# Patient Record
Sex: Male | Born: 1985 | Race: Black or African American | Hispanic: No | Marital: Single
Health system: Southern US, Community
[De-identification: ages and names within clinical notes are randomized; demographics above are authoritative.]

## PROBLEM LIST (undated history)

## (undated) DIAGNOSIS — F319 Bipolar disorder, unspecified: Secondary | ICD-10-CM

## (undated) DIAGNOSIS — F411 Generalized anxiety disorder: Secondary | ICD-10-CM

## (undated) DIAGNOSIS — F410 Panic disorder [episodic paroxysmal anxiety] without agoraphobia: Secondary | ICD-10-CM

## (undated) DIAGNOSIS — F209 Schizophrenia, unspecified: Secondary | ICD-10-CM

## (undated) DIAGNOSIS — F909 Attention-deficit hyperactivity disorder, unspecified type: Secondary | ICD-10-CM

## (undated) DIAGNOSIS — F812 Mathematics disorder: Secondary | ICD-10-CM

## (undated) DIAGNOSIS — F419 Anxiety disorder, unspecified: Secondary | ICD-10-CM

## (undated) DIAGNOSIS — F810 Specific reading disorder: Secondary | ICD-10-CM

---
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
## 2008-06-23 ENCOUNTER — Emergency Department (HOSPITAL_COMMUNITY): Admission: EM | Admit: 2008-06-23 | Discharge: 2008-06-23 | Payer: Self-pay | Admitting: Emergency Medicine

## 2010-03-06 ENCOUNTER — Emergency Department (HOSPITAL_COMMUNITY): Admission: EM | Admit: 2010-03-06 | Discharge: 2010-03-06 | Payer: Self-pay | Admitting: Emergency Medicine

## 2010-09-24 ENCOUNTER — Inpatient Hospital Stay (HOSPITAL_COMMUNITY)
Admission: RE | Admit: 2010-09-24 | Discharge: 2010-09-29 | DRG: 885 | Disposition: A | Discharge status: Home / Self Care | Payer: 59 | Source: Ambulatory Visit | Attending: Psychiatry | Admitting: Psychiatry

## 2010-09-24 ENCOUNTER — Emergency Department (HOSPITAL_COMMUNITY)
Admission: EM | Admit: 2010-09-24 | Discharge: 2010-09-24 | Disposition: A | Discharge status: Other Acute Inpatient Hospital | Payer: Self-pay | Attending: Emergency Medicine | Admitting: Emergency Medicine

## 2010-09-24 DIAGNOSIS — G520 Disorders of olfactory nerve: Secondary | ICD-10-CM

## 2010-09-24 DIAGNOSIS — Z818 Family history of other mental and behavioral disorders: Secondary | ICD-10-CM

## 2010-09-24 DIAGNOSIS — F29 Unspecified psychosis not due to a substance or known physiological condition: Principal | ICD-10-CM

## 2010-09-24 DIAGNOSIS — F39 Unspecified mood [affective] disorder: Secondary | ICD-10-CM

## 2010-09-24 DIAGNOSIS — F3289 Other specified depressive episodes: Secondary | ICD-10-CM | POA: Insufficient documentation

## 2010-09-24 DIAGNOSIS — R45851 Suicidal ideations: Secondary | ICD-10-CM | POA: Insufficient documentation

## 2010-09-24 DIAGNOSIS — R443 Hallucinations, unspecified: Secondary | ICD-10-CM | POA: Insufficient documentation

## 2010-09-24 DIAGNOSIS — F329 Major depressive disorder, single episode, unspecified: Secondary | ICD-10-CM | POA: Insufficient documentation

## 2010-09-24 DIAGNOSIS — Z560 Unemployment, unspecified: Secondary | ICD-10-CM

## 2010-09-24 LAB — DIFFERENTIAL
Basophils Absolute: 0 10*3/uL (ref 0.0–0.1)
Basophils Relative: 1 % (ref 0–1)
Eosinophils Absolute: 0.1 10*3/uL (ref 0.0–0.7)
Eosinophils Relative: 1 % (ref 0–5)
Lymphocytes Relative: 33 % (ref 12–46)
Lymphs Abs: 1.8 10*3/uL (ref 0.7–4.0)
Monocytes Absolute: 0.5 10*3/uL (ref 0.1–1.0)
Monocytes Relative: 8 % (ref 3–12)
Neutro Abs: 3.1 10*3/uL (ref 1.7–7.7)
Neutrophils Relative %: 57 % (ref 43–77)

## 2010-09-24 LAB — BASIC METABOLIC PANEL
Calcium: 9.1 mg/dL (ref 8.4–10.5)
GFR calc non Af Amer: >60 mL/min (ref >60)
Glucose, Bld: 101 mg/dL — ABNORMAL HIGH (ref 70–99)
Sodium: 138 mEq/L (ref 135–145)

## 2010-09-24 LAB — URINALYSIS, ROUTINE W REFLEX MICROSCOPIC
Glucose, UA: NEGATIVE mg/dL
Ketones, ur: NEGATIVE mg/dL
pH: 6.5 (ref 5.0–8.0)

## 2010-09-24 LAB — ETHANOL: Alcohol, Ethyl (B): 6 mg/dL (ref 0–10)

## 2010-09-24 LAB — RAPID URINE DRUG SCREEN, HOSP PERFORMED: Cocaine: NOT DETECTED

## 2010-09-24 LAB — CBC
HCT: 42.1 % (ref 39.0–52.0)
Hemoglobin: 14.3 g/dL (ref 13.0–17.0)
Platelets: 174 10*3/uL (ref 150–400)
RBC: 4.62 MIL/uL (ref 4.22–5.81)
RDW: 12.8 % (ref 11.5–15.5)

## 2010-09-25 DIAGNOSIS — F29 Unspecified psychosis not due to a substance or known physiological condition: Secondary | ICD-10-CM

## 2010-09-27 NOTE — H&P (Signed)
Michael Dudley, BENCOMO                ACCOUNT NO.:  [id number]  MEDICAL RECORD NO.:  [id number]           PATIENT TYPE:  I  LOCATION:  0404                          FACILITY:  [facility]  PHYSICIAN:  Syed T. Arfeen, M.D.   DATE OF BIRTH:  July 23, 1986  DATE OF ADMISSION:  09/24/2010 DATE OF DISCHARGE:                      PSYCHIATRIC ADMISSION ASSESSMENT   This is a 25 year old single African American male.  He is a voluntary admission.  He presented to the [facility] reporting "mood changes," thoughts of suicide, and hurting others.  He has a history for these same complaints but no prior treatment.  He was interviewed in conjunction with Dr. Lolly Mustache.  He states that recently he has been going from place to place, as he has no income.  He has currently been staying with his children's mother.  He has a 2-year- old daughter, a 77-year-old son.  He had gone to visit the children and her stepfather was there.  This "triggered him."  He is essentially homeless.  He states that he does attend [employer] trying to get his GED. Apparently last summer, he reports that he tried to hang himself.  He states that he does hear voices at times, mostly his name or telling him to do something, nothing that is command and negative in nature.  His intake states that he was somewhat hyperreligious, that he cannot be in a crowd.  He states that his biological father was a Optician, dispensing, and he does ask inappropriately what my religious affiliation is.  PAST PSYCHIATRIC HISTORY:  He has no prior history or treatment.  SOCIAL HISTORY:  He reports that he is currently attending [employer] to obtain his GED.  He has a 31-year-old daughter, a 82- year-old son by the same mother, but they are not married.  FAMILY HISTORY:  He reports his mother, sister, and cousin all take medications for various and sundry things, including ADD.  His maternal great grandfather was "crazy."  ALCOHOL/DRUG HISTORY:  He  uses alcohol daily.  He states he cannot handle alcohol.  PRIMARY CARE PROVIDER:  He has no PCC.  MEDICAL PROBLEMS:  He reports he has severe allergies to fruits and vegetables.  MEDICATIONS:  He has none prescribed.  DRUG ALLERGIES:  NO KNOWN DRUG ALLERGIES.  PERTINENT PHYSICAL FINDINGS:  A well-developed, well-nourished African American male in no acute distress.  Appears his stated age. His temperature was afebrile, 98.2 to 98.4.  His pulse was 59-98, respirations were 16-20, and blood pressure was 113/71 to 124/66.  He had no remarkable findings on his CBC.  His pain meds showed that his glucose was slightly elevated at 101.  His UDS was positive for marijuana, and he did have an alcohol level of 8. His urinalysis was negative.  He does not complain of any physical issues.  MENTAL STATUS EXAM:  He was seen in his room in bed.  He was alert and oriented.  He was appropriately groomed, dressed, and nourished.  His speech was not pressured.  His thought processes were clear, rational, and goal-oriented.  Judgment and insight are fair.  Concentration and memory  are intact.  Intelligence is average.  He is not actively suicidal or homicidal at this time.  He does report that occasionally he hears his name being called, and he told the intake person that sometimes he has visual hallucinations.  DIAGNOSES:  AXIS I:  Depression with psychotic features versus psychosis, not otherwise specified.  AXIS II:  Rule out trauma.  AXIS III:  None known.  AXIS IV:  Severe.  He has issues with primary support, with education, occupation, housing, Nurse, children's.  He is off of probation for 2 years.  He has had prior issues with breaking and entering, guns, and marijuana.  AXIS V:  40.  The plan is to admit for safety and stabilization.  We will have to identify care.  He states he came to the hospital to "have someone to talk to," although he is willing to start medication.  Toward  that end, we will start some Risperdal 0.25 mg p.o. b.i.d., 0.5 at bedtime working towards a total dose of 1 mg at bedtime by the time he discharges.  He will also work with the case manager regarding placement, voc rehab, etc.     Vic Ripper, P.A.-C.   ______________________________ Phillips Grout. Lolly Mustache, M.D.    MD/MEDQ  D:  09/25/2010  T:  09/25/2010  Job:  [id number]  Electronically Signed by Jaci Lazier ADAMS P.A.-C. on 09/26/2010 01:59:09 PM Electronically Signed by Kathryne Sharper M.D. on 09/27/2010 09:23:19 AM

## 2010-10-01 NOTE — H&P (Signed)
[name], [name]                ACCOUNT NO.:  [id number]  MEDICAL RECORD NO.:  [id number]           PATIENT TYPE:  I  LOCATION:  0404                          FACILITY:  [facility]  PHYSICIAN:  Eulogio Ditch, MD DATE OF BIRTH:  09-15-1985  DATE OF ADMISSION:  09/24/2010 DATE OF DISCHARGE:  09/29/2010                      PSYCHIATRIC ADMISSION ASSESSMENT   IDENTIFYING INFORMATION:  A 25 year old single African American male. This is a voluntary admission.  HISTORY OF PRESENT ILLNESS:  This is Michael Dudley's first [facility] admission.  He was brought to the emergency room by his mother due to about 2 weeks of increasing depression with suicidal thoughts.  He had reported a history of depression since age 3 and 2 previous attempts to hang himself; the most recent in August of 2011.  He reports that at times he develops racing thoughts and he hears voices.  He cites recent stressors of having difficulties providing for his family and getting adequate jobs. He denies any current substance abuse but admits he has a distant history of substance abuse and has had legal charges in the past which are currently resolved but make it difficult for him to find adequate employment.  No homicidal thoughts.  He does report intermittently smoking marijuana.  On initial presentation he is fully alert, cooperative, with good eye contact and reported he really did not want to hurt himself but feared that he would since he feels unable to control his thoughts and urges when his mind starts going fast.  MEDICAL EVALUATION:  Physical exam and diagnostic studies were done in the [facility] Emergency Room.  A healthy African American male in no acute distress.  Alcohol level 6.  CBC normal.  Hemoglobin 14.3.  Normal chemistry.  BUN 11, creatinine 1.01.  Urine drug screen positive for marijuana metabolites.  Routine urinalysis negative.  VITAL SIGNS: Normal.  He has no chronic medical conditions and no regular  primary care physician.  COURSE OF HOSPITALIZATION:  He was admitted to our acute stabilization and intensive care unit.  He was gradually assimilated into the milieu and remained cooperative, with good participation in unit activities and group psychotherapy.  We elected to start him on risperidone 0.25 mg 1 tablet b.i.d. and 2 tablets at bedtime.  He tolerated the medication well without any signs of EPS.  Motor movements remain smooth, gait normal with no rigidity, cogwheel or other side effects.  He gave [place] permission to speak with his mother.  He had thoughts of shooting himself with a gun but had given the gun to his mother and we did confirm with the mother that the gun was secured.  Mother felt safe taking him home and was encouraged by his verbal commitment to remain on his medications.  Teyon was accepting of a referral to [facility] to help with job training and opportunities.  By September 29, 2010 he was in full contact with reality, no dangerous thoughts, continuing to be fully alert, cooperative and planning for outpatient followup.  DISCHARGE PLAN:  [facility] on October 01, 2010 at 8 a.m.  DISCHARGE DIAGNOSES:  AXIS I:  Psychotic  disorder not otherwise specified.  Mood disorder not otherwise specified.  Cannabis abuse. AXIS II:  No diagnosis. AXIS III:  No diagnosis. AXIS IV:  Significant issues with unemployment and financial pressures. Having supportive family is an asset. AXIS V:  Current 57, past year 66 estimated.  DISCHARGE MEDICATIONS:  Risperidone 0.25 mg 1 tablet twice daily and 2 tablets at h.s.     Young Berry. Lorin Picket, N.P.   ______________________________ Eulogio Ditch, MD    MAS/MEDQ  D:  09/29/2010  T:  09/29/2010  Job:  [id number]  Electronically Signed by Kari Baars N.P. on 09/30/2010 09:11:59 AM Electronically Signed by Eulogio Ditch  on 09/30/2010 04:42:15 PM

## 2011-04-18 ENCOUNTER — Emergency Department (HOSPITAL_COMMUNITY): Payer: Self-pay

## 2011-04-18 ENCOUNTER — Emergency Department (HOSPITAL_COMMUNITY)
Admission: EM | Admit: 2011-04-18 | Discharge: 2011-04-18 | Disposition: A | Discharge status: Home / Self Care | Payer: Self-pay | Attending: Emergency Medicine | Admitting: Emergency Medicine

## 2011-04-18 DIAGNOSIS — R059 Cough, unspecified: Secondary | ICD-10-CM | POA: Insufficient documentation

## 2011-04-18 DIAGNOSIS — R0602 Shortness of breath: Secondary | ICD-10-CM | POA: Insufficient documentation

## 2011-04-18 DIAGNOSIS — R05 Cough: Secondary | ICD-10-CM | POA: Insufficient documentation

## 2011-04-18 DIAGNOSIS — F329 Major depressive disorder, single episode, unspecified: Secondary | ICD-10-CM | POA: Insufficient documentation

## 2011-04-18 DIAGNOSIS — F3289 Other specified depressive episodes: Secondary | ICD-10-CM | POA: Insufficient documentation

## 2011-04-18 DIAGNOSIS — M549 Dorsalgia, unspecified: Secondary | ICD-10-CM | POA: Insufficient documentation

## 2011-04-18 DIAGNOSIS — J069 Acute upper respiratory infection, unspecified: Secondary | ICD-10-CM | POA: Insufficient documentation

## 2011-04-18 DIAGNOSIS — J3489 Other specified disorders of nose and nasal sinuses: Secondary | ICD-10-CM | POA: Insufficient documentation

## 2011-04-18 DIAGNOSIS — IMO0001 Reserved for inherently not codable concepts without codable children: Secondary | ICD-10-CM | POA: Insufficient documentation

## 2011-10-31 ENCOUNTER — Encounter (HOSPITAL_COMMUNITY): Payer: Self-pay | Admitting: Emergency Medicine

## 2011-10-31 ENCOUNTER — Emergency Department (HOSPITAL_COMMUNITY)
Admission: EM | Admit: 2011-10-31 | Discharge: 2011-10-31 | Disposition: A | Discharge status: Home / Self Care | Payer: MEDICAID | Attending: Emergency Medicine | Admitting: Emergency Medicine

## 2011-10-31 DIAGNOSIS — F319 Bipolar disorder, unspecified: Secondary | ICD-10-CM | POA: Insufficient documentation

## 2011-10-31 DIAGNOSIS — F411 Generalized anxiety disorder: Secondary | ICD-10-CM | POA: Insufficient documentation

## 2011-10-31 DIAGNOSIS — F419 Anxiety disorder, unspecified: Secondary | ICD-10-CM

## 2011-10-31 DIAGNOSIS — F909 Attention-deficit hyperactivity disorder, unspecified type: Secondary | ICD-10-CM | POA: Insufficient documentation

## 2011-10-31 DIAGNOSIS — F17200 Nicotine dependence, unspecified, uncomplicated: Secondary | ICD-10-CM | POA: Insufficient documentation

## 2011-10-31 DIAGNOSIS — F209 Schizophrenia, unspecified: Secondary | ICD-10-CM | POA: Insufficient documentation

## 2011-10-31 HISTORY — DX: Attention-deficit hyperactivity disorder, unspecified type: F90.9

## 2011-10-31 HISTORY — DX: Anxiety disorder, unspecified: F41.9

## 2011-10-31 HISTORY — DX: Bipolar disorder, unspecified: F31.9

## 2011-10-31 HISTORY — DX: Schizophrenia, unspecified: F20.9

## 2011-10-31 NOTE — ED Provider Notes (Signed)
History     CSN: [id number]  Arrival date & time 10/31/11  1023   First MD Initiated Contact with Patient 10/31/11 1052      Chief Complaint  Patient presents with  . Anxiety    (Consider location/radiation/quality/duration/timing/severity/associated sxs/prior treatment) Patient is a 26 y.o. male presenting with anxiety. The history is provided by the patient.  Anxiety   patient here after developing dizziness and became lightheaded as he was about to go to court. Similar symptoms associated with anxiety attacks. He denies any suicidal or homicidal ideations. Notes slight chest tightness but no severe chest pain. Patient had nausea but no vomiting. Patient also has a history of bipolar disorder and schizophrenia and has been compliant with his medications. Denies any hallucinations. Patient states he feels back to his baseline at this time  Past Medical History  Diagnosis Date  . Bipolar 1 disorder   . ADHD (attention deficit hyperactivity disorder)   . Schizophrenia   . Anxiety     History reviewed. No pertinent past surgical history.  No family history on file.  History  Substance Use Topics  . Smoking status: Current Everyday Smoker  . Smokeless tobacco: Not on file  . Alcohol Use: No      Review of Systems  All other systems reviewed and are negative.    Allergies  Review of patient's allergies indicates no known allergies.  Home Medications   Current Outpatient Rx  Name Route Sig Dispense Refill  . RISPERDAL PO Oral Take 1 tablet by mouth daily.      BP 120/69  Pulse 67  Temp(Src) 98.4 F (36.9 C) (Oral)  Resp 18  SpO2 100%  Physical Exam  Nursing note and vitals reviewed. Constitutional: He is oriented to person, place, and time. He appears well-developed and well-nourished.  Non-toxic appearance. No distress.  HENT:  Head: Normocephalic and atraumatic.  Eyes: Conjunctivae, EOM and lids are normal. Pupils are equal, round, and reactive to  light.  Neck: Normal range of motion. Neck supple. No tracheal deviation present. No mass present.  Cardiovascular: Normal rate, regular rhythm and normal heart sounds.  Exam reveals no gallop.   No murmur heard. Pulmonary/Chest: Effort normal and breath sounds normal. No stridor. No respiratory distress. He has no decreased breath sounds. He has no wheezes. He has no rhonchi. He has no rales.  Abdominal: Soft. Normal appearance and bowel sounds are normal. He exhibits no distension. There is no tenderness. There is no rebound and no CVA tenderness.  Musculoskeletal: Normal range of motion. He exhibits no edema and no tenderness.  Neurological: He is alert and oriented to person, place, and time. He has normal strength. No cranial nerve deficit or sensory deficit. GCS eye subscore is 4. GCS verbal subscore is 5. GCS motor subscore is 6.  Skin: Skin is warm and dry. No abrasion and no rash noted.  Psychiatric: He has a normal mood and affect. His speech is normal and behavior is normal.    ED Course  Procedures (including critical care time)  Labs Reviewed - No data to display No results found.   No diagnosis found.    MDM  Patient states that he is doing much better. Maryclare Labrador take his normal daily medications and  followup with his Dr .as needed        Toy Baker, MD 10/31/11 1101

## 2011-10-31 NOTE — Discharge Instructions (Signed)

## 2011-10-31 NOTE — ED Notes (Signed)
Pt presenting to ed with c/o in court this morning and developing dizziness and feeling like he was about to have an anxiety attack. Pt denies chest pain at this time. Pt states feels like he has an lump in his throat that's causing him to feel nauseous pt is alert and oriented. Pt is in nad. cbg via ems 104.

## 2012-02-19 ENCOUNTER — Emergency Department (HOSPITAL_COMMUNITY): Payer: No Typology Code available for payment source

## 2012-02-19 ENCOUNTER — Emergency Department (HOSPITAL_COMMUNITY)
Admission: EM | Admit: 2012-02-19 | Discharge: 2012-02-19 | Disposition: A | Discharge status: Home / Self Care | Payer: No Typology Code available for payment source | Attending: Emergency Medicine | Admitting: Emergency Medicine

## 2012-02-19 ENCOUNTER — Encounter (HOSPITAL_COMMUNITY): Payer: Self-pay | Admitting: *Deleted

## 2012-02-19 DIAGNOSIS — R109 Unspecified abdominal pain: Secondary | ICD-10-CM | POA: Insufficient documentation

## 2012-02-19 DIAGNOSIS — Z8659 Personal history of other mental and behavioral disorders: Secondary | ICD-10-CM | POA: Insufficient documentation

## 2012-02-19 DIAGNOSIS — Y92410 Unspecified street and highway as the place of occurrence of the external cause: Secondary | ICD-10-CM | POA: Insufficient documentation

## 2012-02-19 DIAGNOSIS — F319 Bipolar disorder, unspecified: Secondary | ICD-10-CM | POA: Insufficient documentation

## 2012-02-19 DIAGNOSIS — F17200 Nicotine dependence, unspecified, uncomplicated: Secondary | ICD-10-CM | POA: Insufficient documentation

## 2012-02-19 DIAGNOSIS — F909 Attention-deficit hyperactivity disorder, unspecified type: Secondary | ICD-10-CM | POA: Insufficient documentation

## 2012-02-19 LAB — POCT I-STAT, CHEM 8
BUN: 10 mg/dL (ref 6–23)
Chloride: 104 mEq/L (ref 96–112)
Creatinine, Ser: 0.9 mg/dL (ref 0.50–1.35)
Glucose, Bld: 91 mg/dL (ref 70–99)
Potassium: 4.1 mEq/L (ref 3.5–5.1)

## 2012-02-19 LAB — CBC
HCT: 43.1 % (ref 39.0–52.0)
Hemoglobin: 15 g/dL (ref 13.0–17.0)
WBC: 5.9 10*3/uL (ref 4.0–10.5)

## 2012-02-19 MED ORDER — IBUPROFEN 600 MG PO TABS: 600.0000 mg | ORAL_TABLET | Freq: Four times a day (QID) | ORAL | Status: AC | PRN

## 2012-02-19 MED ORDER — IOHEXOL 300 MG/ML  SOLN
100.0000 mL | Freq: Once | INTRAMUSCULAR | Status: AC | PRN
  Administered 2012-02-19: 100 mL via INTRAVENOUS

## 2012-02-19 MED ORDER — SODIUM CHLORIDE 0.9 % IV SOLN
Freq: Once | INTRAVENOUS | Status: AC
  Administered 2012-02-19: 500 mL via INTRAVENOUS

## 2012-02-19 MED ORDER — ONDANSETRON HCL 4 MG/2ML IJ SOLN
4.0000 mg | Freq: Once | INTRAMUSCULAR | Status: AC
  Administered 2012-02-19: 4 mg via INTRAVENOUS
  Filled 2012-02-19: qty 2

## 2012-02-19 MED ORDER — HYDROMORPHONE HCL PF 1 MG/ML IJ SOLN
1.0000 mg | Freq: Once | INTRAMUSCULAR | Status: AC
  Administered 2012-02-19: 1 mg via INTRAVENOUS
  Filled 2012-02-19: qty 1

## 2012-02-19 MED ORDER — OXYCODONE-ACETAMINOPHEN 5-325 MG PO TABS: 1.0000 | ORAL_TABLET | Freq: Four times a day (QID) | ORAL | Status: AC | PRN
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
## 2012-02-20 NOTE — ED Provider Notes (Signed)
Medical screening examination/treatment/procedure(s) were performed by non-physician practitioner and as supervising physician I was immediately available for consultation/collaboration.  Derwood Kaplan, MD 02/20/12 [phone]

## 2013-09-14 ENCOUNTER — Encounter (HOSPITAL_COMMUNITY): Payer: Self-pay | Admitting: Emergency Medicine

## 2013-09-14 ENCOUNTER — Emergency Department (HOSPITAL_COMMUNITY)
Admission: EM | Admit: 2013-09-14 | Discharge: 2013-09-14 | Discharge status: Against Medical Advice | Payer: Medicaid Other | Attending: Emergency Medicine | Admitting: Emergency Medicine

## 2013-09-14 DIAGNOSIS — S0993XA Unspecified injury of face, initial encounter: Secondary | ICD-10-CM | POA: Insufficient documentation

## 2013-09-14 DIAGNOSIS — R55 Syncope and collapse: Secondary | ICD-10-CM | POA: Insufficient documentation

## 2013-09-14 DIAGNOSIS — Z8659 Personal history of other mental and behavioral disorders: Secondary | ICD-10-CM | POA: Insufficient documentation

## 2013-09-14 DIAGNOSIS — Z79899 Other long term (current) drug therapy: Secondary | ICD-10-CM | POA: Insufficient documentation

## 2013-09-14 DIAGNOSIS — S199XXA Unspecified injury of neck, initial encounter: Secondary | ICD-10-CM

## 2013-09-14 DIAGNOSIS — F17200 Nicotine dependence, unspecified, uncomplicated: Secondary | ICD-10-CM | POA: Insufficient documentation

## 2013-09-14 DIAGNOSIS — Z9119 Patient's noncompliance with other medical treatment and regimen: Secondary | ICD-10-CM

## 2013-09-14 DIAGNOSIS — R5383 Other fatigue: Secondary | ICD-10-CM

## 2013-09-14 DIAGNOSIS — Z5320 Procedure and treatment not carried out because of patient's decision for unspecified reasons: Secondary | ICD-10-CM

## 2013-09-14 DIAGNOSIS — R296 Repeated falls: Secondary | ICD-10-CM | POA: Insufficient documentation

## 2013-09-14 DIAGNOSIS — W19XXXA Unspecified fall, initial encounter: Secondary | ICD-10-CM

## 2013-09-14 DIAGNOSIS — R5381 Other malaise: Secondary | ICD-10-CM | POA: Insufficient documentation

## 2013-09-14 DIAGNOSIS — Y9389 Activity, other specified: Secondary | ICD-10-CM | POA: Insufficient documentation

## 2013-09-14 DIAGNOSIS — S298XXA Other specified injuries of thorax, initial encounter: Secondary | ICD-10-CM | POA: Insufficient documentation

## 2013-09-14 DIAGNOSIS — Y92009 Unspecified place in unspecified non-institutional (private) residence as the place of occurrence of the external cause: Secondary | ICD-10-CM | POA: Insufficient documentation

## 2013-09-14 HISTORY — DX: Generalized anxiety disorder: F41.1

## 2013-09-14 HISTORY — DX: Mathematics disorder: F81.2

## 2013-09-14 HISTORY — DX: Panic disorder (episodic paroxysmal anxiety): F41.0

## 2013-09-14 HISTORY — DX: Specific reading disorder: F81.0

## 2013-09-14 MED ORDER — SODIUM CHLORIDE 0.9 % IV BOLUS (SEPSIS): 1000.0000 mL | Freq: Once | INTRAVENOUS | Status: DC

## 2013-09-14 NOTE — ED Notes (Addendum)
Pt from home via [facility] with c/o dizziness after taking trazodone and mirtazapine with 2 beers.  Pt fell from a standing position in the kitchen this am, c/o of left jaw pain and left groin pain.  Pt states "everything was black."  Pt in NAD, A&O.

## 2013-09-14 NOTE — ED Notes (Signed)
Pt got upset with his mother and walked out to the lobby saying he was leaving.  Notified Dr Micheline Mazeocherty.

## 2013-09-14 NOTE — ED Notes (Signed)
Pt refused to have an IV started and the NS bolus ordered.  Notified Dr. Micheline Mazeocherty.

## 2013-09-14 NOTE — ED Provider Notes (Signed)
CSN: [id number]     Arrival date & time 09/14/13  4098 History   First MD Initiated Contact with Patient 09/14/13 [phone]     Chief Complaint  Patient presents with  . Dizziness  . Fall     (Consider location/radiation/quality/duration/timing/severity/associated sxs/prior Treatment) HPI Comments: Pt drank a couple beers last night then took his home mirtazapine & trazodone and went to bed.  He woke up to urinate, and fell in the bathroom several times, believes because he passed out. He has been having intermittent sharp, L sided CP that is worse w/ palpation for weeks to months.  He also has pain in L jaw.   Patient is a 28 y.o. male presenting with syncope.  Loss of Consciousness Episode history:  Multiple Most recent episode:  Today Timing:  Sporadic Progression:  Unchanged Chronicity:  New Context: standing up and urination   Witnessed: no   Relieved by:  Bed rest Worsened by:  Nothing tried Ineffective treatments:  None tried Associated symptoms: chest pain and malaise/fatigue   Associated symptoms: no confusion, no difficulty breathing, no dizziness, no fever, no focal sensory loss, no focal weakness, no headaches, no nausea, no shortness of breath, no vomiting and no weakness   Chest pain:    Quality:  Sharp   Severity:  Mild   Onset quality:  Unable to specify   Duration:  1 month   Timing:  Intermittent   Progression:  Waxing and waning   Chronicity:  Recurrent   Past Medical History  Diagnosis Date  . Bipolar 1 disorder   . ADHD (attention deficit hyperactivity disorder)   . Schizophrenia   . Anxiety   . Panic disorder   . Generalized anxiety disorder   . Learning difficulty involving mathematics   . Learning difficulty involving reading    History reviewed. No pertinent past surgical history. History reviewed. No pertinent family history. History  Substance Use Topics  . Smoking status: Current Every Day Smoker -- 0.25 packs/day    Types: Cigarettes  .  Smokeless tobacco: Not on file  . Alcohol Use: 7.2 oz/week    12 Cans of beer per week    Review of Systems  Constitutional: Positive for malaise/fatigue. Negative for fever, activity change, appetite change and fatigue.  HENT: Negative for congestion, facial swelling, rhinorrhea and trouble swallowing.   Eyes: Negative for photophobia and pain.  Respiratory: Negative for cough, chest tightness and shortness of breath.   Cardiovascular: Positive for chest pain and syncope. Negative for leg swelling.  Gastrointestinal: Negative for nausea, vomiting, abdominal pain, diarrhea and constipation.  Endocrine: Negative for polydipsia and polyuria.  Genitourinary: Negative for dysuria, urgency, decreased urine volume and difficulty urinating.  Musculoskeletal: Negative for back pain and gait problem.  Skin: Negative for color change, rash and wound.  Allergic/Immunologic: Negative for immunocompromised state.  Neurological: Negative for dizziness, focal weakness, facial asymmetry, speech difficulty, weakness, numbness and headaches.  Psychiatric/Behavioral: Negative for confusion, decreased concentration and agitation.      Allergies  Review of patient's allergies indicates no known allergies.  Home Medications   Current Outpatient Rx  Name  Route  Sig  Dispense  Refill  . thiothixene (NAVANE) 10 MG capsule   Oral   Take 10 mg by mouth 2 (two) times daily as needed.          BP 106/59  Pulse 69  Temp(Src) 97.7 F (36.5 C) (Oral)  Resp 13  SpO2 100% Physical Exam  Constitutional: He is  oriented to person, place, and time. He appears well-developed and well-nourished. No distress.  HENT:  Head: Normocephalic and atraumatic.    Mouth/Throat: No oropharyngeal exudate.  Eyes: Pupils are equal, round, and reactive to light.  Neck: Normal range of motion. Neck supple.  Cardiovascular: Normal rate, regular rhythm and normal heart sounds.  Exam reveals no gallop and no friction rub.    No murmur heard. Pulmonary/Chest: Effort normal and breath sounds normal. No respiratory distress. He has no wheezes. He has no rales.  Abdominal: Soft. Bowel sounds are normal. He exhibits no distension and no mass. There is no tenderness. There is no rebound and no guarding.  Musculoskeletal: Normal range of motion. He exhibits no edema and no tenderness.       Hands: Neurological: He is alert and oriented to person, place, and time. He has normal strength. He displays no tremor. No cranial nerve deficit or sensory deficit. He exhibits normal muscle tone. He displays a negative Romberg sign. Coordination and gait normal. GCS eye subscore is 4. GCS verbal subscore is 5. GCS motor subscore is 6.  Skin: Skin is warm and dry.  Psychiatric: He has a normal mood and affect.    ED Course  Procedures (including critical care time) Labs Review Labs Reviewed - No data to display Imaging Review No results found.  EKG Interpretation    Date/Time:  Saturday September 14 2013 08:51:21 EST Ventricular Rate:  69 PR Interval:  151 QRS Duration: 103 QT Interval:  418 QTC Calculation: 448 R Axis:   42 Text Interpretation:  Sinus arrhythmia No prior for comparison.  Confirmed by Roxy Filler  MD, Aubry Tucholski [phone] on 09/14/2013 9:22:28 AM            MDM   Final diagnoses:  Syncope  Fall  Left before treatment completed    Pt is a 28 y.o. male with Pmhx as above who presents with dizziness, syncope early this morning when he got up from bed to urinate. Pt had drank a couple beers and taken trazodone & mirtazapine before bed.  On PE, pt sleepy, but GCS 15, No focal neuro findings. +ttp over L angle of mandible, L thumb, L chest wall.  Pt denied suicide attempt or ideation, state he just took his home meds.  Pt states he has had weeks to months of intermittent L sided chest pain that is worse when his anxiety worsens.  Plan for orthostatics, IVF, CXR, XR thumb & mandible.  Pt eloped from dept shortly  after w/u started.  He did have EKG which showed no signs of ischemia. Doubt ACS and suspect combination of ETOH & home meds as cause of syncope.         Shanna CiscoMegan E Sheresa Cullop, MD 09/14/13 [phone]

## 2013-11-01 ENCOUNTER — Emergency Department (HOSPITAL_COMMUNITY)
Admission: EM | Admit: 2013-11-01 | Discharge: 2013-11-02 | Discharge status: Against Medical Advice | Payer: Medicaid Other | Attending: Emergency Medicine | Admitting: Emergency Medicine

## 2013-11-01 ENCOUNTER — Encounter (HOSPITAL_COMMUNITY): Payer: Self-pay | Admitting: Emergency Medicine

## 2013-11-01 DIAGNOSIS — H579 Unspecified disorder of eye and adnexa: Secondary | ICD-10-CM | POA: Insufficient documentation

## 2013-11-01 DIAGNOSIS — F17200 Nicotine dependence, unspecified, uncomplicated: Secondary | ICD-10-CM | POA: Insufficient documentation

## 2013-11-01 DIAGNOSIS — H5710 Ocular pain, unspecified eye: Secondary | ICD-10-CM | POA: Insufficient documentation

## 2013-11-01 DIAGNOSIS — R05 Cough: Secondary | ICD-10-CM | POA: Insufficient documentation

## 2013-11-01 DIAGNOSIS — R51 Headache: Secondary | ICD-10-CM | POA: Insufficient documentation

## 2013-11-01 DIAGNOSIS — J3489 Other specified disorders of nose and nasal sinuses: Secondary | ICD-10-CM | POA: Insufficient documentation

## 2013-11-01 DIAGNOSIS — R059 Cough, unspecified: Secondary | ICD-10-CM | POA: Insufficient documentation

## 2013-11-01 NOTE — ED Notes (Signed)
C/o allergies, nasal congestion, running nose, itchy eyes and throat, also facial sinus and eye pain, onset of sx 2-3 weeks ago, intermittant cough (denies: nvd, fever or dizziness).

## 2013-11-01 NOTE — ED Notes (Signed)
Pt walked out prior to official/ formal d/c, "did not want to wait".

## 2014-05-27 DIAGNOSIS — W260XXA Contact with knife, initial encounter: Secondary | ICD-10-CM | POA: Diagnosis not present

## 2014-05-27 DIAGNOSIS — S61212A Laceration without foreign body of right middle finger without damage to nail, initial encounter: Secondary | ICD-10-CM | POA: Diagnosis not present

## 2014-05-27 DIAGNOSIS — Z8659 Personal history of other mental and behavioral disorders: Secondary | ICD-10-CM | POA: Insufficient documentation

## 2014-05-27 DIAGNOSIS — Y93G1 Activity, food preparation and clean up: Secondary | ICD-10-CM | POA: Insufficient documentation

## 2014-05-27 DIAGNOSIS — Z720 Tobacco use: Secondary | ICD-10-CM | POA: Diagnosis not present

## 2014-05-27 DIAGNOSIS — Y9289 Other specified places as the place of occurrence of the external cause: Secondary | ICD-10-CM | POA: Insufficient documentation

## 2014-05-28 ENCOUNTER — Encounter (HOSPITAL_COMMUNITY): Payer: Self-pay

## 2014-05-28 ENCOUNTER — Emergency Department (HOSPITAL_COMMUNITY)
Admission: EM | Admit: 2014-05-28 | Discharge: 2014-05-28 | Disposition: A | Discharge status: Home / Self Care | Payer: Medicaid Other | Attending: Emergency Medicine | Admitting: Emergency Medicine

## 2014-05-28 DIAGNOSIS — S61219A Laceration without foreign body of unspecified finger without damage to nail, initial encounter: Secondary | ICD-10-CM

## 2014-05-28 DIAGNOSIS — IMO0002 Reserved for concepts with insufficient information to code with codable children: Secondary | ICD-10-CM

## 2014-05-28 MED ORDER — LIDOCAINE HCL (PF) 1 % IJ SOLN
5.0000 mL | Freq: Once | INTRAMUSCULAR | Status: AC
  Administered 2014-05-28: 5 mL via INTRADERMAL
  Filled 2014-05-28: qty 5

## 2014-05-28 MED ORDER — CEPHALEXIN 500 MG PO CAPS: 500.0000 mg | ORAL_CAPSULE | Freq: Four times a day (QID) | ORAL | Status: AC

## 2014-05-28 MED ORDER — HYDROCODONE-ACETAMINOPHEN 5-325 MG PO TABS
1.0000 | ORAL_TABLET | Freq: Once | ORAL | Status: AC
  Administered 2014-05-28: 1 via ORAL
  Filled 2014-05-28: qty 1

## 2014-05-28 MED ORDER — TETANUS-DIPHTH-ACELL PERTUSSIS 5-2.5-18.5 LF-MCG/0.5 IM SUSP
0.5000 mL | Freq: Once | INTRAMUSCULAR | Status: AC
  Administered 2014-05-28: 0.5 mL via INTRAMUSCULAR
  Filled 2014-05-28: qty 0.5

## 2014-05-28 MED ORDER — HYDROCODONE-ACETAMINOPHEN 5-325 MG PO TABS: 1.0000 | ORAL_TABLET | ORAL | Status: AC | PRN

## 2014-05-28 NOTE — ED Provider Notes (Addendum)
CSN: [id number]     Arrival date & time 05/27/14  2357 History   First MD Initiated Contact with Patient 05/28/14 0044     Chief Complaint  Patient presents with  . Extremity Laceration     (Consider location/radiation/quality/duration/timing/severity/associated sxs/prior Treatment) HPI Comments: 3rd right finger cut with knife while ashing dishes earlier tonight. No other injury.  Patient is a 28 y.o. male presenting with skin laceration. The history is provided by the patient. No language interpreter was used.  Laceration Location:  Hand Hand laceration location:  R finger Depth:  Through dermis Foreign body present:  No foreign bodies Tetanus status:  Out of date   Past Medical History  Diagnosis Date  . Bipolar 1 disorder   . ADHD (attention deficit hyperactivity disorder)   . Schizophrenia   . Anxiety   . Panic disorder   . Generalized anxiety disorder   . Learning difficulty involving mathematics   . Learning difficulty involving reading    History reviewed. No pertinent past surgical history. History reviewed. No pertinent family history. History  Substance Use Topics  . Smoking status: Current Every Day Smoker -- 0.25 packs/day    Types: Cigarettes  . Smokeless tobacco: Not on file  . Alcohol Use: 7.2 oz/week    12 Cans of beer per week    Review of Systems  Constitutional: Negative for fever.  Musculoskeletal:       See HPI.  Skin: Negative for wound.  Neurological: Negative for numbness.      Allergies  Review of patient's allergies indicates no known allergies.  Home Medications   Prior to Admission medications   Medication Sig Start Date End Date Taking? Authorizing Provider  diphenhydrAMINE (BENADRYL) 25 MG tablet Take 25 mg by mouth every 6 (six) hours as needed for allergies.    Historical Provider, MD   BP 115/66 mmHg  Pulse 72  Temp(Src) 98.2 F (36.8 C) (Oral)  Resp 18  Ht 5\' 5"  (1.651 m)  Wt 138 lb (62.596 kg)  BMI 22.96 kg/m2   SpO2 100% Physical Exam  Constitutional: He is oriented to person, place, and time. He appears well-developed and well-nourished. No distress.  Musculoskeletal:  FROM right 3rd digit with flexion limited by pain at PIP. Full flexion and extension of DIP joint.   Neurological: He is alert and oriented to person, place, and time.  Skin: Skin is warm and dry.  2 cm laceration dorsolateral 3rd finger on right.   Psychiatric: He has a normal mood and affect.    ED Course  Procedures (including critical care time) Labs Review Labs Reviewed - No data to display  Imaging Review No results found.   EKG Interpretation None     LACERATION REPAIR Performed by: Elpidio AnisUPSTILL, SHARI A Authorized by: Elpidio AnisUPSTILL, SHARI A Consent: Verbal consent obtained. Risks and benefits: risks, benefits and alternatives were discussed Consent given by: patient Patient identity confirmed: provided demographic data Prepped and Draped in normal sterile fashion Wound explored  Laceration Location: right 3rd finger  Laceration Length: 2cm  No Foreign Bodies seen or palpated  Anesthesia: local infiltration  Local anesthetic: lidocaine 1% w/o epinephrine  Anesthetic total: 2 ml  Irrigation method: syringe Amount of cleaning: standard  Skin closure: 4-0 prolene  Number of sutures: 5  Technique: simple interrupted  Patient tolerance: Patient tolerated the procedure well with no immediate complications.  MDM    Dione Boozeavid Louellen Haldeman, MD 05/28/14 [phone]
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
## 2014-05-28 NOTE — Progress Notes (Signed)
Orthopedic Tech Progress Note Patient Details:  Michael Dudley 08-02-1985 [id number]  Ortho Devices Type of Ortho Device: Finger splint Ortho Device/Splint Interventions: Application   Haskell Flirtewsome, Devery Murgia M 05/28/2014, 2:07 AM

## 2014-05-28 NOTE — Discharge Instructions (Signed)
Td Vaccine (Tetanus and Diphtheria): What You Need to Know °1. Why get vaccinated? °Tetanus  and diphtheria are very serious diseases. They are rare in the [place] today, but people who do become infected often have severe complications. Td vaccine is used to protect adolescents and adults from both of these diseases. °Both tetanus and diphtheria are infections caused by bacteria. Diphtheria spreads from person to person through coughing or sneezing. Tetanus-causing bacteria enter the body through cuts, scratches, or wounds. °TETANUS (Lockjaw) causes painful muscle tightening and stiffness, usually all over the body. °· It can lead to tightening of muscles in the head and neck so you can't open your mouth, swallow, or sometimes even breathe. Tetanus kills about 1 out of every 5 people who are infected. °DIPHTHERIA can cause a thick coating to form in the back of the throat. °· It can lead to breathing problems, paralysis, heart failure, and death. °Before vaccines, the [place] saw as many as 200,000 cases a year of diphtheria and hundreds of cases of tetanus. Since vaccination began, cases of both diseases have dropped by about 99%. °2. Td vaccine °Td vaccine can protect adolescents and adults from tetanus and diphtheria. Td is usually given as a booster dose every 10 years but it can also be given earlier after a severe and dirty wound or burn. °Your doctor can give you more information. °Td may safely be given at the same time as other vaccines. °3. Some people should not get this vaccine °· If you ever had a life-threatening allergic reaction after a dose of any tetanus or diphtheria containing vaccine, OR if you have a severe allergy to any part of this vaccine, you should not get Td. Tell your doctor if you have any severe allergies. °· Talk to your doctor if you: °¨ have epilepsy or another nervous system problem, °¨ had severe pain or swelling after any vaccine containing diphtheria or  tetanus, °¨ ever had Guillain Barré Syndrome (GBS), °¨ aren't feeling well on the day the shot is scheduled. °4. Risks of a vaccine reaction °With a vaccine, like any medicine, there is a chance of side effects. These are usually mild and go away on their own. °Serious side effects are also possible, but are very rare. °Most people who get Td vaccine do not have any problems with it. °Mild Problems  following Td °(Did not interfere with activities) °· Pain where the shot was given (about 8 people in 10) °· Redness or swelling where the shot was given (about 1 person in 3) °· Mild fever (about 1 person in 15) °· Headache or Tiredness (uncommon) °Moderate Problems following Td °(Interfered with activities, but did not require medical attention) °· Fever over 102°F (rare) °Severe Problems  following Td °(Unable to perform usual activities; required medical attention) °· Swelling, severe pain, bleeding and/or redness in the arm where the shot was given (rare). °Problems that could happen after any vaccine: °· Brief fainting spells can happen after any medical procedure, including vaccination. Sitting or lying down for about 15 minutes can help prevent fainting, and injuries caused by a fall. Tell your doctor if you feel dizzy, or have vision changes or ringing in the ears. °· Severe shoulder pain and reduced range of motion in the arm where a shot was given can happen, very rarely, after a vaccination. °· Severe allergic reactions from a vaccine are very rare, estimated at less than 1 in a million doses. If one were to occur,   it would usually be within a few minutes to a few hours after the vaccination. 5. What if there is a serious reaction? What should I look for?  Look for anything that concerns you, such as signs of a severe allergic reaction, very high fever, or behavior changes. Signs of a severe allergic reaction can include hives, swelling of the face and throat, difficulty breathing, a fast heartbeat,  dizziness, and weakness. These would usually start a few minutes to a few hours after the vaccination. What should I do?  If you think it is a severe allergic reaction or other emergency that can't wait, call 9-1-1 or get the person to the nearest hospital. Otherwise, call your doctor.  Afterward, the reaction should be reported to the Vaccine Adverse Event Reporting System (VAERS). Your doctor might file this report, or you can do it yourself through the VAERS web site at www.vaers.LAgents.nohhs.gov, or by calling 1-(269) 394-2068. VAERS is only for reporting reactions. They do not give medical advice. 6. The National Vaccine Injury Compensation Program The [employer] Vaccine Injury Compensation Program (VICP) is a federal program that was created to compensate people who may have been injured by certain vaccines. Persons who believe they may have been injured by a vaccine can learn about the program and about filing a claim by calling 1-(458)123-5707 or visiting the VICP website at SpiritualWord.atwww.hrsa.gov/vaccinecompensation. 7. How can I learn more?  Ask your doctor.  Contact your local or state health department.  Contact the Centers for Disease Control and Prevention (CDC):  Call (220) 347-28401-989-004-7793 (1-800-CDC-INFO)  Visit CDC's website at PicCapture.uywww.cdc.gov/vaccines CDC Td Vaccine Interim VIS (08/28/12) Document Released: 05/08/2006 Document Revised: 11/25/2013 Document Reviewed: 10/23/2013 [facility] Patient Information 2015 [employer], [place]. This information is not intended to replace advice given to you by your health care provider. Make sure you discuss any questions you have with your health care provider. Sutured Wound Care Sutures are stitches that can be used to close wounds. Wound care helps prevent pain and infection.  HOME CARE INSTRUCTIONS   Rest and elevate the injured area until all the pain and swelling are gone.  Only take over-the-counter or prescription medicines for pain, discomfort, or fever as directed  by your caregiver.  After 48 hours, gently wash the area with mild soap and water once a day, or as directed. Rinse off the soap. Pat the area dry with a clean towel. Do not rub the wound. This may cause bleeding.  Follow your caregiver's instructions for how often to change the bandage (dressing). Stop using a dressing after 2 days or after the wound stops draining.  If the dressing sticks, moisten it with soapy water and gently remove it.  Apply ointment on the wound as directed.  Avoid stretching a sutured wound.  Drink enough fluids to keep your urine clear or pale yellow.  Follow up with your caregiver for suture removal as directed.  Use sunscreen on your wound for the next 3 to 6 months so the scar will not darken. SEEK IMMEDIATE MEDICAL CARE IF:   Your wound becomes red, swollen, hot, or tender.  You have increasing pain in the wound.  You have a red streak that extends from the wound.  There is pus coming from the wound.  You have a fever.  You have shaking chills.  There is a bad smell coming from the wound.  You have persistent bleeding from the wound. MAKE SURE YOU:   Understand these instructions.  Will watch your condition.  Will get help right away if you are not doing well or get worse. Document Released: 08/18/2004 Document Revised: 10/03/2011 Document Reviewed: 11/14/2010 [facility] Patient Information 2015 [place], [place]. This information is not intended to replace advice given to you by your health care provider. Make sure you discuss any questions you have with your health care provider.

## 2014-05-28 NOTE — ED Notes (Signed)
28 year old male suffered a laceration to his right fourth finger at the level of the PIP joint. The laceration is on the radial side and undermines the skin on the dorsal surface. He resists any attempt to test for tendon stability. I do not see any evidence of tendon injury but he will be referred to hand surgeon for follow-up.  Medical screening examination/treatment/procedure(s) were conducted as a shared visit with non-physician practitioner(s) and myself.  I personally evaluated the patient during the encounter.    Dione Boozeavid Raelynn Corron, MD 05/28/14 [phone]

## 2017-05-23 ENCOUNTER — Encounter (HOSPITAL_COMMUNITY): Payer: Self-pay | Admitting: Emergency Medicine

## 2017-05-23 ENCOUNTER — Emergency Department (HOSPITAL_COMMUNITY)
Admission: EM | Admit: 2017-05-23 | Discharge: 2017-05-23 | Disposition: A | Discharge status: Home / Self Care | Payer: Self-pay | Attending: Emergency Medicine | Admitting: Emergency Medicine

## 2017-05-23 ENCOUNTER — Emergency Department (HOSPITAL_COMMUNITY): Payer: Self-pay

## 2017-05-23 DIAGNOSIS — M7918 Myalgia, other site: Secondary | ICD-10-CM

## 2017-05-23 DIAGNOSIS — M79632 Pain in left forearm: Secondary | ICD-10-CM | POA: Insufficient documentation

## 2017-05-23 DIAGNOSIS — M25561 Pain in right knee: Secondary | ICD-10-CM | POA: Insufficient documentation

## 2017-05-23 DIAGNOSIS — Z79899 Other long term (current) drug therapy: Secondary | ICD-10-CM | POA: Insufficient documentation

## 2017-05-23 DIAGNOSIS — Y999 Unspecified external cause status: Secondary | ICD-10-CM | POA: Insufficient documentation

## 2017-05-23 DIAGNOSIS — R0781 Pleurodynia: Secondary | ICD-10-CM

## 2017-05-23 DIAGNOSIS — Y92410 Unspecified street and highway as the place of occurrence of the external cause: Secondary | ICD-10-CM | POA: Insufficient documentation

## 2017-05-23 DIAGNOSIS — M25562 Pain in left knee: Secondary | ICD-10-CM | POA: Insufficient documentation

## 2017-05-23 DIAGNOSIS — Y939 Activity, unspecified: Secondary | ICD-10-CM | POA: Insufficient documentation

## 2017-05-23 DIAGNOSIS — F17210 Nicotine dependence, cigarettes, uncomplicated: Secondary | ICD-10-CM | POA: Insufficient documentation

## 2017-05-23 DIAGNOSIS — R0789 Other chest pain: Secondary | ICD-10-CM | POA: Insufficient documentation

## 2017-05-23 MED ORDER — METHOCARBAMOL 500 MG PO TABS: 500.0000 mg | ORAL_TABLET | Freq: Two times a day (BID) | ORAL | 0 refills | Status: AC

## 2017-05-23 MED ORDER — IBUPROFEN 600 MG PO TABS
600.0000 mg | ORAL_TABLET | Freq: Four times a day (QID) | ORAL | 0 refills | Status: AC | PRN
Start: 2017-05-23 — End: ?
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
## 2017-05-23 NOTE — ED Notes (Signed)
Patient ambulated to room from lobby with no complaints. Going to Xray at this time.

## 2017-05-23 NOTE — ED Notes (Signed)
Patient is A&Ox4.  No signs of distress noted.  Please see providers complete history and physical exam.  
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
## 2019-10-04 ENCOUNTER — Emergency Department (HOSPITAL_COMMUNITY)
Admission: EM | Admit: 2019-10-04 | Discharge: 2019-10-05 | Disposition: A | Discharge status: Home / Self Care | Payer: Medicaid Other | Attending: Emergency Medicine | Admitting: Emergency Medicine

## 2019-10-04 ENCOUNTER — Encounter (HOSPITAL_COMMUNITY): Payer: Self-pay | Admitting: *Deleted

## 2019-10-04 ENCOUNTER — Emergency Department (HOSPITAL_COMMUNITY): Payer: Medicaid Other

## 2019-10-04 ENCOUNTER — Other Ambulatory Visit: Payer: Self-pay

## 2019-10-04 DIAGNOSIS — Z23 Encounter for immunization: Secondary | ICD-10-CM | POA: Diagnosis not present

## 2019-10-04 DIAGNOSIS — S81831A Puncture wound without foreign body, right lower leg, initial encounter: Secondary | ICD-10-CM | POA: Diagnosis not present

## 2019-10-04 DIAGNOSIS — Y929 Unspecified place or not applicable: Secondary | ICD-10-CM | POA: Insufficient documentation

## 2019-10-04 DIAGNOSIS — Y939 Activity, unspecified: Secondary | ICD-10-CM | POA: Diagnosis not present

## 2019-10-04 DIAGNOSIS — F17210 Nicotine dependence, cigarettes, uncomplicated: Secondary | ICD-10-CM | POA: Diagnosis not present

## 2019-10-04 DIAGNOSIS — Y999 Unspecified external cause status: Secondary | ICD-10-CM | POA: Diagnosis not present

## 2019-10-04 DIAGNOSIS — W3400XA Accidental discharge from unspecified firearms or gun, initial encounter: Secondary | ICD-10-CM | POA: Diagnosis not present

## 2019-10-04 LAB — CBC WITH DIFFERENTIAL/PLATELET
Abs Immature Granulocytes: 0.16 10*3/uL — ABNORMAL HIGH (ref 0.00–0.07)
Basophils Absolute: 0.1 10*3/uL (ref 0.0–0.1)
Basophils Relative: 1 %
Eosinophils Absolute: 0.2 10*3/uL (ref 0.0–0.5)
Eosinophils Relative: 2 %
HCT: 43.6 % (ref 39.0–52.0)
Hemoglobin: 14 g/dL (ref 13.0–17.0)
Immature Granulocytes: 2 %
Lymphocytes Relative: 45 %
Lymphs Abs: 5 10*3/uL — ABNORMAL HIGH (ref 0.7–4.0)
MCH: 30.2 pg (ref 26.0–34.0)
MCHC: 32.1 g/dL (ref 30.0–36.0)
MCV: 94.2 fL (ref 80.0–100.0)
Monocytes Absolute: 0.7 10*3/uL (ref 0.1–1.0)
Monocytes Relative: 6 %
Neutro Abs: 4.8 10*3/uL (ref 1.7–7.7)
Neutrophils Relative %: 44 %
Platelets: 282 10*3/uL (ref 150–400)
RBC: 4.63 MIL/uL (ref 4.22–5.81)
RDW: 11.9 % (ref 11.5–15.5)
WBC: 11 10*3/uL — ABNORMAL HIGH (ref 4.0–10.5)
nRBC: 0 % (ref 0.0–0.2)

## 2019-10-04 LAB — COMPREHENSIVE METABOLIC PANEL
ALT: 13 U/L (ref 0–44)
AST: 21 U/L (ref 15–41)
Albumin: 4.1 g/dL (ref 3.5–5.0)
Alkaline Phosphatase: 73 U/L (ref 38–126)
Anion gap: 18 — ABNORMAL HIGH (ref 5–15)
BUN: 13 mg/dL (ref 6–20)
CO2: 17 mmol/L — ABNORMAL LOW (ref 22–32)
Calcium: 9.3 mg/dL (ref 8.9–10.3)
Chloride: 104 mmol/L (ref 98–111)
Creatinine, Ser: 1.41 mg/dL — ABNORMAL HIGH (ref 0.61–1.24)
GFR calc Af Amer: >60 mL/min (ref >60)
GFR calc non Af Amer: >60 mL/min (ref >60)
Glucose, Bld: 125 mg/dL — ABNORMAL HIGH (ref 70–99)
Potassium: 3.6 mmol/L (ref 3.5–5.1)
Sodium: 139 mmol/L (ref 135–145)
Total Bilirubin: 0.6 mg/dL (ref 0.3–1.2)
Total Protein: 6.8 g/dL (ref 6.5–8.1)

## 2019-10-04 LAB — I-STAT CHEM 8, ED
BUN: 14 mg/dL (ref 6–20)
Calcium, Ion: 1.11 mmol/L — ABNORMAL LOW (ref 1.15–1.40)
Chloride: 103 mmol/L (ref 98–111)
Creatinine, Ser: 1.6 mg/dL — ABNORMAL HIGH (ref 0.61–1.24)
Glucose, Bld: 119 mg/dL — ABNORMAL HIGH (ref 70–99)
HCT: 44 % (ref 39.0–52.0)
Hemoglobin: 15 g/dL (ref 13.0–17.0)
Potassium: 3.4 mmol/L — ABNORMAL LOW (ref 3.5–5.1)
Sodium: 139 mmol/L (ref 135–145)
TCO2: 20 mmol/L — ABNORMAL LOW (ref 22–32)

## 2019-10-04 MED ORDER — HYDROCODONE-ACETAMINOPHEN 5-325 MG PO TABS: 1.0000 | ORAL_TABLET | Freq: Four times a day (QID) | ORAL | 0 refills | Status: AC | PRN

## 2019-10-04 MED ORDER — TETANUS-DIPHTH-ACELL PERTUSSIS 5-2.5-18.5 LF-MCG/0.5 IM SUSP
0.5000 mL | Freq: Once | INTRAMUSCULAR | Status: AC
  Administered 2019-10-04: 0.5 mL via INTRAMUSCULAR

## 2019-10-04 MED ORDER — DOXYCYCLINE HYCLATE 100 MG PO TABS
100.0000 mg | ORAL_TABLET | Freq: Once | ORAL | Status: AC
  Administered 2019-10-05: 100 mg via ORAL
  Filled 2019-10-04: qty 1

## 2019-10-04 MED ORDER — SODIUM CHLORIDE 0.9 % IV BOLUS
1000.0000 mL | Freq: Once | INTRAVENOUS | Status: AC
  Administered 2019-10-04: 1000 mL via INTRAVENOUS

## 2019-10-04 MED ORDER — DOXYCYCLINE HYCLATE 100 MG PO CAPS: 100.0000 mg | ORAL_CAPSULE | Freq: Two times a day (BID) | ORAL | 0 refills | Status: AC

## 2019-10-04 MED ORDER — FENTANYL CITRATE (PF) 100 MCG/2ML IJ SOLN
50.0000 ug | INTRAMUSCULAR | Status: DC | PRN
  Administered 2019-10-04: 50 ug via INTRAVENOUS
  Filled 2019-10-04: qty 2

## 2019-10-04 MED ORDER — FENTANYL CITRATE (PF) 100 MCG/2ML IJ SOLN
50.0000 ug | Freq: Once | INTRAMUSCULAR | Status: AC
  Administered 2019-10-04: 50 ug via INTRAVENOUS
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
## 2019-10-04 NOTE — ED Notes (Signed)
P[ts mother at  The bedside  He has been demanding  To have water  Pt up around the stretcher and alm,ost fell not listening to anything  He wants water  The resident has given him water he was just nauseated
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
## 2019-10-04 NOTE — ED Notes (Signed)
Pt tolerated PO fluids well  
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
## 2019-10-07 ENCOUNTER — Encounter (HOSPITAL_COMMUNITY): Payer: Self-pay | Admitting: Emergency Medicine

## 2020-03-17 ENCOUNTER — Ambulatory Visit (HOSPITAL_COMMUNITY)
Admission: EM | Admit: 2020-03-17 | Discharge: 2020-03-17 | Disposition: A | Discharge status: Home / Self Care | Payer: Medicaid Other | Attending: Physician Assistant | Admitting: Physician Assistant

## 2020-03-17 ENCOUNTER — Other Ambulatory Visit: Payer: Self-pay

## 2020-03-17 DIAGNOSIS — Z20822 Contact with and (suspected) exposure to covid-19: Secondary | ICD-10-CM | POA: Insufficient documentation

## 2020-03-17 LAB — SARS CORONAVIRUS 2 (TAT 6-24 HRS): SARS Coronavirus 2: NEGATIVE

## 2020-03-17 NOTE — ED Triage Notes (Signed)
Nurse visit. Pt was exposed to COVID. Pt denies sx at this time.

## 2020-07-16 ENCOUNTER — Ambulatory Visit (HOSPITAL_COMMUNITY)
Admission: EM | Admit: 2020-07-16 | Discharge: 2020-07-16 | Disposition: A | Discharge status: Home / Self Care | Payer: Medicaid Other | Attending: Emergency Medicine | Admitting: Emergency Medicine

## 2020-07-16 ENCOUNTER — Other Ambulatory Visit: Payer: Self-pay

## 2020-07-16 ENCOUNTER — Encounter (HOSPITAL_COMMUNITY): Payer: Self-pay

## 2020-07-16 DIAGNOSIS — L03011 Cellulitis of right finger: Secondary | ICD-10-CM

## 2020-07-16 MED ORDER — SULFAMETHOXAZOLE-TRIMETHOPRIM 800-160 MG PO TABS: 1.0000 | ORAL_TABLET | Freq: Two times a day (BID) | ORAL | 0 refills | Status: AC

## 2020-07-16 NOTE — Discharge Instructions (Signed)
Take Bactrim twice daily for the next 7 days. Soak finger in warm soapy water and push cuticle back as shown at urgent care to prevent collection of infection under the skin around the fingernail. Do warm soapy soaks 3 times daily.
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
